# Patient Record
Sex: Female | Born: 1990 | Race: Black or African American | Hispanic: No | Marital: Single | Smoking: Never smoker
Health system: Southern US, Community
[De-identification: ages and names within clinical notes are randomized; demographics above are authoritative.]

## PROBLEM LIST (undated history)

## (undated) DIAGNOSIS — J45909 Unspecified asthma, uncomplicated: Secondary | ICD-10-CM

## (undated) DIAGNOSIS — G43909 Migraine, unspecified, not intractable, without status migrainosus: Secondary | ICD-10-CM

## (undated) DIAGNOSIS — J302 Other seasonal allergic rhinitis: Secondary | ICD-10-CM

---

## 2013-07-29 ENCOUNTER — Emergency Department (INDEPENDENT_AMBULATORY_CARE_PROVIDER_SITE_OTHER)
Admission: EM | Admit: 2013-07-29 | Discharge: 2013-07-29 | Disposition: A | Discharge status: Home / Self Care | Payer: BC Managed Care – PPO | Source: Home / Self Care | Attending: Family Medicine | Admitting: Family Medicine

## 2013-07-29 ENCOUNTER — Encounter (HOSPITAL_COMMUNITY): Payer: Self-pay | Admitting: Emergency Medicine

## 2013-07-29 DIAGNOSIS — M542 Cervicalgia: Secondary | ICD-10-CM

## 2013-07-29 DIAGNOSIS — K120 Recurrent oral aphthae: Secondary | ICD-10-CM

## 2013-07-29 HISTORY — DX: Migraine, unspecified, not intractable, without status migrainosus: G43.909

## 2013-07-29 HISTORY — DX: Other seasonal allergic rhinitis: J30.2

## 2013-07-29 HISTORY — DX: Unspecified asthma, uncomplicated: J45.909

## 2013-07-29 MED ORDER — MAGIC MOUTHWASH W/LIDOCAINE: 5.0000 mL | Freq: Three times a day (TID) | ORAL | Status: DC | PRN

## 2013-07-29 NOTE — ED Notes (Signed)
C/O "canker sore" to right posterior tongue x 1 wk.  Pain now radiating up into right ear.  Having difficulty eating and talking due to pain.  Has been using pain relief gel and "canker covers".

## 2013-07-29 NOTE — ED Provider Notes (Signed)
CSN: [id number]     Arrival date & time 07/29/13  1006 History   First MD Initiated Contact with Patient 07/29/13 1112     Chief Complaint  Patient presents with  . Mouth Lesions   (Consider location/radiation/quality/duration/timing/severity/associated sxs/prior Treatment) HPI  23 year old F with lesions in her mouth and right ear pain.   1. Oral lesion - presently right side of the tongue for one week. It is painful. It is not changing in size. It is not draining or bleeding. It is not associated with fevers. Does make it difficult for her to eat and swallow. She has a history of similar lesions on her fecal mucosa, which resolved within a week. She has tried Orajel. The patient has not been sexually active recently.  Past medical history - negative for HIV, Crohn's disease, Behcet's disease, herpes labialis  2. Ear pain - pain is mild to moderate in 3 years duration. It is actually located posterior and inferior to the ear. It is not associated with decreased hearing, blood, or drainage from the ear. She denies a history of trauma. She denies fever or chills.  Past Medical History  Diagnosis Date  . Seasonal allergies   . Asthma   . Migraines    History reviewed. No pertinent past surgical history. No family history on file. History  Substance Use Topics  . Smoking status: Never Smoker   . Smokeless tobacco: Not on file  . Alcohol Use: No   OB History   Grav Para Term Preterm Abortions TAB SAB Ect Mult Living                 Review of Systems Negative for nausea, vomiting, rash, diarrhea, constipation, bloody stool Allergies  Review of patient's allergies indicates no known allergies.  Home Medications   Current Outpatient Rx  Name  Route  Sig  Dispense  Refill  . desogestrel-ethinyl estradiol (KARIVA,AZURETTE,MIRCETTE) 0.15-0.02/0.01 MG (21/5) tablet   Oral   Take 1 tablet by mouth daily.         . Alum & Mag Hydroxide-Simeth (MAGIC MOUTHWASH W/LIDOCAINE)  SOLN   Oral   Take 5 mLs by mouth 3 (three) times daily as needed for mouth pain.   50 mL   0    BP 113/72  Pulse 78  Temp(Src) 98.6 F (37 C) (Oral)  Resp 18  SpO2 98%  LMP 06/21/2013 Physical Exam Gen: young AA female, well appearing, NAD, pleasant and conversant HEENT: NCAT, PERRLA, EOMI, OP clear and moist, 3 mm white ulcer of right side of tongue without erythema or drainage that is tender; no oropharyngeal exudate, no lymphadenopathy, neck with normal ROM, no meningismus, TM reflective without effusion; mastoid bone non tender  CV: RRR, no m/r/g, no JVD or carotid bruits Pulm: normal WOB, CTA-B Abd: soft, NDNT, NABS Extremities: no edema or joint tenderness Skin: warm, dry, no rashes Neuro/Psych: A&Ox4, normal affect, speech, and thought content  ED Course  Procedures (including critical care time) Labs Review Labs Reviewed - No data to display Imaging Review No results found.   MDM   1. Aphthous ulcer of tongue   2. Neck pain on right side    History physical most consistent with aphthous ulcer. Patient does not have systemic illness or signs of systemic illnesses like HIV, Crohn's, or Behcet's disease. She does not have the other sequelae of herpangina or coxsackievirus. I think this will resolve on its own. Patient was given indications for return.  Neck  pain appears very benign. No concern for infection of the mastoid bone. No evidence of otitis media. Continue to follow as needed.   Garnetta BuddyEdward V Rochell Puett, MD 07/29/13 1201

## 2013-07-29 NOTE — Discharge Instructions (Signed)
Akira,   I think he have an aphthous ulcer that is causing the pain. Thankfully you are a healthy woman otherwise not at risk for any systemic diseases or cancers. If this does not resolve within one week, then please return, because it may need a biopsy. In the meantime please take Orajel and you can use the mouthwash as needed for pain relief.  I hope that you feel better.  Dr. Clinton SawyerWilliamson

## 2013-08-01 NOTE — ED Provider Notes (Signed)
Medical screening examination/treatment/procedure(s) were performed by a resident physician or non-physician practitioner and as the supervising physician I was immediately available for consultation/collaboration.  Delisa Finck, MD   Evian Derringer S Saafir Abdullah, MD 08/01/13 0748 

## 2014-01-14 ENCOUNTER — Encounter (HOSPITAL_COMMUNITY): Payer: Self-pay | Admitting: Emergency Medicine

## 2014-01-14 ENCOUNTER — Emergency Department (HOSPITAL_COMMUNITY)
Admission: EM | Admit: 2014-01-14 | Discharge: 2014-01-15 | Disposition: A | Discharge status: Home / Self Care | Payer: BC Managed Care – PPO | Attending: Emergency Medicine | Admitting: Emergency Medicine

## 2014-01-14 DIAGNOSIS — R110 Nausea: Secondary | ICD-10-CM | POA: Diagnosis not present

## 2014-01-14 DIAGNOSIS — R51 Headache: Secondary | ICD-10-CM

## 2014-01-14 DIAGNOSIS — M549 Dorsalgia, unspecified: Secondary | ICD-10-CM | POA: Diagnosis not present

## 2014-01-14 DIAGNOSIS — J45909 Unspecified asthma, uncomplicated: Secondary | ICD-10-CM | POA: Diagnosis not present

## 2014-01-14 DIAGNOSIS — R079 Chest pain, unspecified: Secondary | ICD-10-CM | POA: Diagnosis not present

## 2014-01-14 DIAGNOSIS — Z79899 Other long term (current) drug therapy: Secondary | ICD-10-CM | POA: Insufficient documentation

## 2014-01-14 DIAGNOSIS — R519 Headache, unspecified: Secondary | ICD-10-CM

## 2014-01-14 DIAGNOSIS — G43909 Migraine, unspecified, not intractable, without status migrainosus: Secondary | ICD-10-CM | POA: Diagnosis present

## 2014-01-14 LAB — HCG, SERUM, QUALITATIVE: Preg, Serum: NEGATIVE

## 2014-01-14 MED ORDER — DIPHENHYDRAMINE HCL 50 MG/ML IJ SOLN
25.0000 mg | Freq: Once | INTRAMUSCULAR | Status: AC
  Administered 2014-01-14: 25 mg via INTRAVENOUS
  Filled 2014-01-14: qty 1

## 2014-01-14 MED ORDER — METHOCARBAMOL 500 MG PO TABS
1000.0000 mg | ORAL_TABLET | Freq: Once | ORAL | Status: AC
  Administered 2014-01-14: 1000 mg via ORAL
  Filled 2014-01-14: qty 2

## 2014-01-14 MED ORDER — METOCLOPRAMIDE HCL 5 MG/ML IJ SOLN
10.0000 mg | Freq: Once | INTRAMUSCULAR | Status: AC
  Administered 2014-01-14: 10 mg via INTRAVENOUS
  Filled 2014-01-14: qty 2

## 2014-01-14 MED ORDER — METHYLPREDNISOLONE SODIUM SUCC 125 MG IJ SOLR
125.0000 mg | Freq: Once | INTRAMUSCULAR | Status: AC
  Administered 2014-01-14: 125 mg via INTRAVENOUS
  Filled 2014-01-14: qty 2

## 2014-01-14 MED ORDER — SODIUM CHLORIDE 0.9 % IV BOLUS (SEPSIS)
1000.0000 mL | Freq: Once | INTRAVENOUS | Status: AC
  Administered 2014-01-14: 1000 mL via INTRAVENOUS

## 2014-01-14 NOTE — ED Notes (Signed)
The pt has had a headache for 30-45 minutes.  Followed by chest pain back ain for 30-45 minutes also .  No injury..  No distress

## 2014-01-14 NOTE — ED Provider Notes (Signed)
CSN: [id number]     Arrival date & time 01/14/14  2013 History   First MD Initiated Contact with Patient 01/14/14 2249     Chief Complaint  Patient presents with  . Migraine     (Consider location/radiation/quality/duration/timing/severity/associated sxs/prior Treatment) HPI  Christy Woods is a 23 y.o. female complaining of migraine exacerbation lasting approximately 2 hours. Patient also states that she has upper back and chest pain. She denies pleuritic pain, fever, chills, shortness of breath, cough, history of DVT or PE. Patient has been evaluated by neurologist formal diagnosis of migraine. She states her pain is bilateral periorbital, rated at 3/10 associated with nausea and photophobia. She states that she only has the upper back and chest pain when the headache is severe. This is typical for her prior headache exacerbations. She used to take Topamax but has not had a prescription in several months. She recently moved to the area to attend school. Pt denies fever, rash, confusion, cervicalgia, LOC/syncope, change in vision, N/V, numbness, weakness, dysarthria, ataxia, thunderclap onset, exacerbation with exertion or valsalva, exacerbation in morning, abdominal pain, change in bowel or bladder habits, family history of early cardiac death.   Past Medical History  Diagnosis Date  . Seasonal allergies   . Asthma   . Migraines    History reviewed. No pertinent past surgical history. No family history on file. History  Substance Use Topics  . Smoking status: Never Smoker   . Smokeless tobacco: Not on file  . Alcohol Use: No   OB History   Grav Para Term Preterm Abortions TAB SAB Ect Mult Living                 Review of Systems    Allergies  Review of patient's allergies indicates no known allergies.  Home Medications   Prior to Admission medications   Medication Sig Start Date End Date Taking? Authorizing Provider  Alum & Mag Hydroxide-Simeth (MAGIC  MOUTHWASH W/LIDOCAINE) SOLN Take 5 mLs by mouth 3 (three) times daily as needed for mouth pain. 07/29/13   Garnetta Buddy, MD  butalbital-acetaminophen-caffeine (FIORICET) [phone] MG per tablet Take 1 tablet by mouth every 6 (six) hours as needed for headache. 01/15/14 01/15/15  Joni Reining Briarrose Shor, PA-C  desogestrel-ethinyl estradiol (KARIVA,AZURETTE,MIRCETTE) 0.15-0.02/0.01 MG (21/5) tablet Take 1 tablet by mouth daily.    Historical Provider, MD   BP 116/82  Pulse 79  Temp(Src) 98.5 F (36.9 C) (Oral)  Resp 21  Ht  (1.575 m)  Wt 150 lb (68.04 kg)  BMI 27.43 kg/m2  SpO2 100% Physical Exam  Nursing note and vitals reviewed. Constitutional: She is oriented to person, place, and time. She appears well-developed and well-nourished.  HENT:  Head: Normocephalic and atraumatic.  Mouth/Throat: Oropharynx is clear and moist.  Eyes: Conjunctivae and EOM are normal. Pupils are equal, round, and reactive to light.  Neck: Normal range of motion. Neck supple.  FROM to C-spine. Pt can touch chin to chest without discomfort. No TTP of midline cervical spine.   Cardiovascular: Normal rate, regular rhythm and intact distal pulses.   Pulmonary/Chest: Effort normal and breath sounds normal. No respiratory distress. She has no wheezes. She has no rales. She exhibits no tenderness.  Abdominal: Soft. Bowel sounds are normal. There is no tenderness.  Musculoskeletal: Normal range of motion. She exhibits no edema and no tenderness.  Neurological: She is alert and oriented to person, place, and time. No cranial nerve deficit.  II-Visual fields grossly intact. III/IV/VI-Extraocular movements  intact.  Pupils reactive bilaterally. V/VII-Smile symmetric, equal eyebrow raise,  facial sensation intact VIII- Hearing grossly intact IX/X-Normal gag XI-bilateral shoulder shrug XII-midline tongue extension Motor: 5/5 bilaterally with normal tone and bulk Cerebellar: Normal finger-to-nose  and normal  heel-to-shin test.   Romberg negative Ambulates with a coordinated gait   Skin: No rash noted.    ED Course  Procedures (including critical care time) Labs Review Labs Reviewed  HCG, SERUM, QUALITATIVE    Imaging Review No results found.   EKG Interpretation None      MDM   Final diagnoses:  Acute nonintractable headache, unspecified headache type    Filed Vitals:   01/14/14 2031 01/14/14 2328 01/14/14 2330 01/15/14 0017  BP: 122/85 127/88 115/81 116/82  Pulse: 89 95 80 79  Temp: 98.1 F (36.7 C) 98.6 F (37 C)  98.5 F (36.9 C)  TempSrc: Oral Oral  Oral  Resp: Height:  (1.575 m)     Weight: 150 lb (68.04 kg)     SpO2: 100% 100% 99% 100%    Medications  sodium chloride 0.9 % bolus 1,000 mL (1,000 mLs Intravenous New Bag/Given 01/14/14 2321)  metoCLOPramide (REGLAN) injection 10 mg (10 mg Intravenous Given 01/14/14 2322)  diphenhydrAMINE (BENADRYL) injection 25 mg (25 mg Intravenous Given 01/14/14 2322)  methylPREDNISolone sodium succinate (SOLU-MEDROL) 125 mg/2 mL injection 125 mg (125 mg Intravenous Given 01/14/14 2322)  methocarbamol (ROBAXIN) tablet 1,000 mg (1,000 mg Oral Given 01/14/14 2326)    Christy Woods is a 23 y.o. female presenting with a grade exacerbation. Very mild, 3/10. Patient states she has upper back and chest pain. States that this is only when her headache is severe. HA. Presentation is like pts typical HA and non concerning for [facility], ICH, Meningitis, or temporal arteritis. Pt is afebrile with no focal neuro deficits, nuchal rigidity, or change in vision. Pt is to follow up with PCP to discuss prophylactic medication. Pt verbalizes understanding and is agreeable with plan to dc.  Evaluation does not show pathology that would require ongoing emergent intervention or inpatient treatment. Pt is hemodynamically stable and mentating appropriately. Discussed findings and plan with patient/guardian, who agrees with care plan.  All questions answered. Return precautions discussed and outpatient follow up given.   New Prescriptions   BUTALBITAL-ACETAMINOPHEN-CAFFEINE (FIORICET) 50-325-40 MG PER TABLET    Take 1 tablet by mouth every 6 (six) hours as needed for headache.         Wynetta Emery, PA-C 01/15/14 0025

## 2014-01-15 MED ORDER — BUTALBITAL-APAP-CAFFEINE 50-325-40 MG PO TABS: 1.0000 | ORAL_TABLET | Freq: Four times a day (QID) | ORAL | Status: AC | PRN

## 2014-01-15 NOTE — Discharge Instructions (Signed)
Do not hesitate to return to the emergency room for any new, worsening or concerning symptoms. ° °Please obtain primary care using resource guide below. But the minute you were seen in the emergency room and that they will need to obtain records for further outpatient management. ° ° ° °Emergency Department Resource Guide °1) Find a Doctor and Pay Out of Pocket °Although you won't have to find out who is covered by your insurance plan, it is a good idea to ask around and get recommendations. You will then need to call the office and see if the doctor you have chosen will accept you as a new patient and what types of options they offer for patients who are self-pay. Some doctors offer discounts or will set up payment plans for their patients who do not have insurance, but you will need to ask so you aren't surprised when you get to your appointment. ° °2) Contact Your Local Health Department °Not all health departments have doctors that can see patients for sick visits, but many do, so it is worth a call to see if yours does. If you don't know where your local health department is, you can check in your phone book. The CDC also has a tool to help you locate your state's health department, and many state websites also have listings of all of their local health departments. ° °3) Find a Walk-in Clinic °If your illness is not likely to be very severe or complicated, you may want to try a walk in clinic. These are popping up all over the country in pharmacies, drugstores, and shopping centers. They're usually staffed by nurse practitioners or physician assistants that have been trained to treat common illnesses and complaints. They're usually fairly quick and inexpensive. However, if you have serious medical issues or chronic medical problems, these are probably not your best option. ° °No Primary Care Doctor: °- Call [facility] at  832-8000 - they can help you locate a primary care doctor that  accepts your  insurance, provides certain services, etc. °- Physician Referral Service- 1-800-533-3463 ° °Chronic Pain Problems: °Organization         Address  Phone   Notes  °[facility]  (336) 297-2271 Patients need to be referred by their primary care doctor.  ° °Medication Assistance: °Organization         Address  Phone   Notes  °[place] [facility] [address] °[place], [place] [address] (336) 641-8030 --Must be a resident of [place] °-- Must have NO insurance coverage whatsoever (no Medicaid/ Medicare, etc.) °-- The pt. MUST have a primary care doctor that directs their care regularly and follows them in the community °  °[facility]  (866) 331-1348   °[employer]  (888) 892-1162   ° °Agencies that provide inexpensive medical care: °Organization         Address  Phone   Notes  °[facility]  (336) 832-8035   °[facility]    (336) 832-7272   °[facility] [address] °[place], [place] [address] (336) 832-4777   °[facility] [address], °[place] (336) 271-4999   °[facility]    (336) 373-0678   °[facility]    (336) 272-1050   °[facility] ° [address],  Phone:  (336) 832-4444, Fax:  (336) 832-4440 Hours of Operation:  9 am -   6 pm, M-F.  Also accepts Medicaid/Medicare and self-pay.  °[facility] ° [address], [place] Phone: (336) 832-3150, Fax: (336) [phone]. Hours of Operation:  8:30 am - 5:30 pm, M-F.  Also accepts Medicaid and self-pay.  °[facility] [place] [address], [place] Phone: (336) 878-6027   °[facility] [address], [place], [place] (336)723-1848, Ext. 123 Mondays & Thursdays: 7-9 AM.  First 15 patients are seen on a first come, first serve basis. °  ° °Medicaid-accepting [place] Providers: ° °Organization          Address  Phone   Notes  °[facility] [address], [place] (336) 641-2100 Also accepts self-pay patients.  °[facility] [address], [place] ° (336) 856-9996   °[facility] [address], [place] (336) 288-8857   °[facility] [address] [place] [address], [place] (336) 299-7000   °Veita Bland [address], [place]  ° (336) 373-1557 Only accepts [place] Access Medicaid patients after they have their name applied to their card.  ° °Self-Pay (no insurance) in [place]: ° °Organization         Address  Phone   Notes  °Sickle Cell Patients, [facility] [address], [place] (336) 832-1970   °[facility] [address], [place] (336) 832-4400   °[facility] ° [address], [place] (336) 992-4800   °[facility]/Dr. Osei-Bonsu ° [address] [place] [address], [place] or [address], [place] (336) 841-8500 Phone number for both [place] and [place] locations is the same.  °[facility] [address], [place] (336) 299-0000   °[facility] [address] [place] [address], [place] or [address] (336) 852-7530 °(336) 878-2260   °[facility] [address], [place] (336) 350-1642, phone; (336) 294-5005, fax Sees patients 1st and 3rd Saturday of every month.  Must not qualify for public or private insurance (i.e. Medicaid, Medicare, [place] Health Choice, Veterans' Benefits) • Household income should be no more than 200% of the poverty level •The clinic cannot treat you if you are pregnant or think you are pregnant • Sexually transmitted diseases are not treated at the clinic.  ° ° °Dental Care: °Organization         Address  Phone  Notes  °[facility] [address],  [place] (336) 641-6152 Accepts children up to age 21 who are enrolled in Medicaid or [place] Health Choice; pregnant women with a Medicaid card; and children who have applied for Medicaid or [place] Health Choice, but were declined, whose parents can pay a reduced fee at time of service.  °[facility] [place]  [address], [place] (336) 641-7733 Accepts children up to age 21 who are enrolled in Medicaid or [place] Health Choice; pregnant women with a Medicaid card; and children who have applied for Medicaid or [place] Health Choice, but were declined, whose parents can pay a reduced fee at time of service.  °[facility] ° [address], [place] (336) 641-4533 Patients are seen by appointment only. Walk-ins are not accepted. [facility] will see patients 18 years of age and   older. °Monday - Tuesday (8am-5pm) °Most Wednesdays (8:30-5pm) °$30 per visit, cash only  °[facility] ° [address], [place] (336) 641-4533 Patients are seen by appointment only. Walk-ins are not accepted. [facility] will see patients 18 years of age and older. °One Wednesday Evening (Monthly: Volunteer Based).  $30 per visit, cash only  °[facility]  (919) 537-3737 for adults; Children under age 4, call Graduate Pediatric Dentistry at (919) [phone]. Children aged 4-14, please call (919) [phone] to request a pediatric application. ° Dental services are provided in all areas of dental care including fillings, crowns and bridges, complete and partial dentures, implants, gum treatment, root canals, and extractions. Preventive care is also provided. Treatment is provided to both adults and children. °Patients are selected via a lottery and there is often a waiting list. °  °[facility] [address], °[place] ° (336) 763-8833 [email or web address] °  °[facility] [address], [place], [place]  (336)723-1848, Ext. 123 Second and Fourth Thursday of each month, opens at 6:30 AM; Clinic ends at 9 AM.  Patients are seen on a first-come first-served basis, and a limited number are seen during each clinic.  ° °[facility] ° [address], [place], [place] (336) 723-7904   Eligibility Requirements °You must have lived in [place], [place], or [place] counties for at least the last three months. °  You cannot be eligible for state or federal sponsored healthcare insurance, including Veterans Administration, Medicaid, or Medicare. °  You generally cannot be eligible for healthcare insurance through your employer.  °  How to apply: °Eligibility screenings are held every Tuesday and Wednesday afternoon from 1:00 pm until 4:00 pm. You do not need an appointment for the interview!  °[facility] [address], [place], [place] 336-631-2330   °[facility] [facility]  336-342-8273   °[facility]  336-703-3100   °[facility]  336-570-6415   ° °Behavioral Health Resources in the Community: °Intensive Outpatient Programs °Organization         Address  Phone  Notes  °[facility] [address], [place], [place] 336-878-6098   °[facility] [address], [place], [place] 336-832-9800   °[facility] [address], [place], [place] ° 336-882-2125   °[facility] [address],  °[place], [place] 1-800-853-5163 or 336-641-4981   °Substance Abuse Resources °Organization         Address  Phone  Notes  °[facility]  336-882-2125   °[facility]  336-784-9470   °[facility]  336-285-9073   °[facility]  336-845-3988   °Residential & Outpatient Substance Abuse Program  1-800-659-3381   °Psychological Services °Organization         Address  Phone  Notes  °[facility]  336- 832-9600   °[facility]  336- 378-7881    °[facility] [address], [place] 1-800-853-5163 or 336-641-4981   ° °Mobile Crisis Teams °Organization         Address  Phone  Notes  °[facility], Mobile Crisis Care Unit  1-877-626-1772   °Assertive °[facility] ° [address] [place],  336-834-9664   °Sharon DeEsch [address] °[place]  336-554-5454   ° °Self-Help/Support Groups °Organization         Address    Phone             Notes  °[facility] [place] - variety of support groups  336- 373-1402 Call for more information  °Narcotics Anonymous (NA), [facility] [address], °[place]  2 meetings at this location  ° °Residential Treatment Programs °Organization         Address  Phone  Notes  °[facility] [address],    °[place]  1-866-801-8205   °[facility] ° [address], [place], [place] 704-293-8524   °[facility] [address], [place] 336-845-3988 Admissions: 8am-3pm M-F  °[facility] [address]    °[place], [place] 336-841-1104   °[facility] [address], [place], [place] 336-379-7146   °[facility] [address]  °[place], [place] 336-285-9073   °[facility] - Intensive Outpatient [address], [place], [place] 336-852-3033   °[facility] [address]  °[place], [place] 1-877-615-2722 or 336-784-9470   °[facility] [address] [place], [place] 336-227-7417 Accepts Medicaid  °[facility] [address]  °[place] 1-800-659-3381 Substance Abuse/Addiction Treatment  ° °[place] Behavioral Health Resources °Organization         Address  Phone  Notes  °[facility]  (888) 581-9988   °Julie Brannon, PhD [address] [place], [place]   (336) 349-5553 or (336) 951-0000   °[facility]   [address] °[place], [place] (336) 349-4454   °[facility] [address], [place], [place] (336) 342-8316 Insurance/Medicaid/sponsorship through [facility]  °[facility] [address]                                    [place],  (336) 342-8316 Therapy/tele-psych/case  °[facility] [address]  ° [place],  (336) 349-2233    °Dr. Arfeen  (336) 349-4544   °[facility] [place]  [employer] [place] [facility] 1) [address], [place] °2) [address], [place] °3)  [address], [place] (336) 349-3220 °(336) 342-7768 ° °(336) 342-8140   °[place] Child Abuse [facility] (336) 342-1394 or (336) 342-3537 (After Hours)    ° ° ° °

## 2014-01-16 NOTE — ED Provider Notes (Signed)
Medical screening examination/treatment/procedure(s) were performed by non-physician practitioner and as supervising physician I was immediately available for consultation/collaboration.  Tressa Maldonado L Arul Farabee, MD 01/16/14 0737 

## 2015-05-27 ENCOUNTER — Ambulatory Visit (INDEPENDENT_AMBULATORY_CARE_PROVIDER_SITE_OTHER): Payer: BLUE CROSS/BLUE SHIELD | Admitting: Family Medicine

## 2015-05-27 VITALS — BP 120/80 | HR 73 | Temp 98.0°F | Resp 17 | Ht 63.0 in | Wt 176.0 lb

## 2015-05-27 DIAGNOSIS — R8299 Other abnormal findings in urine: Secondary | ICD-10-CM | POA: Diagnosis not present

## 2015-05-27 DIAGNOSIS — R8290 Unspecified abnormal findings in urine: Secondary | ICD-10-CM

## 2015-05-27 LAB — POC MICROSCOPIC URINALYSIS (UMFC): MUCUS RE: ABSENT

## 2015-05-27 LAB — POCT URINALYSIS DIP (MANUAL ENTRY)
BILIRUBIN UA: NEGATIVE
Bilirubin, UA: NEGATIVE
GLUCOSE UA: NEGATIVE
Leukocytes, UA: NEGATIVE
Nitrite, UA: NEGATIVE
Protein Ur, POC: NEGATIVE
SPEC GRAV UA: 1.01
Urobilinogen, UA: 0.2
pH, UA: 6

## 2015-05-27 NOTE — Patient Instructions (Signed)

## 2015-05-27 NOTE — Progress Notes (Signed)
Subjective:  By signing my name below, I, Christy Woods, attest that this documentation has been prepared under the direction and in the presence of Christy Sorenson, MD.  Christy Woods, Medical Scribe. 05/27/2015.  4:12 PM.   Patient ID: Christy Woods, female    DOB: [birth year], 25 y.o.   MRN: [id number]  Chief Complaint  Patient presents with  . Dysuria    HPI HPI Comments: Christy Woods is a 25 y.o. female who presents to [facility] complaining of green colored urine, onset starting today. She states that she has been constipated recently, however she indicates that she usually does experience that prior to menstruating. She indicates that her diet has changed since 2 weeks ago, and is including more green nutrients. She also notes increasing her water intake to 1 Gallon per day in the effort of being healthier. She has also been taking vitamin C, and melatonin starting last week, however she is taking no other medications. She indicates having a history of BV, and yeast infections. Pt is currently on her period, and reports using tampons usually. She denies dysuria, urinary frequency, vaginal discharge, enuresis, kidney pain, unexpected muscle pain, diaphoresis, fever, chills, nausea that is different from when she is normally menstruating, or vomiting. Pt is not currently sexually active, and reports no chance of being pregnant.    There are no active problems to display for this patient.  Past Medical History  Diagnosis Date  . Seasonal allergies   . Asthma   . Migraines    No past surgical history on file. No Known Allergies Prior to Admission medications   Not on File   Social History   Social History  . Marital Status: Single    Spouse Name: N/A  . Number of Children: N/A  . Years of Education: N/A   Occupational History  . Not on file.   Social History Main Topics  . Smoking status: Never Smoker   . Smokeless tobacco:  Not on file  . Alcohol Use: No  . Drug Use: No  . Sexual Activity: Not on file   Other Topics Concern  . Not on file   Social History Narrative    Review of Systems  Constitutional: Negative for fever, chills and diaphoresis.  Gastrointestinal: Positive for constipation. Negative for nausea and vomiting.  Genitourinary: Negative for dysuria, frequency, flank pain, vaginal discharge and enuresis.  Musculoskeletal: Negative for myalgias.      Objective:   Physical Exam  Constitutional: She is oriented to person, place, and time. She appears well-developed and well-nourished. No distress.  HENT:  Head: Normocephalic and atraumatic.  Eyes: EOM are normal. Pupils are equal, round, and reactive to light.  Neck: Neck supple.  Cardiovascular: Normal rate and regular rhythm.   Pulmonary/Chest: Effort normal and breath sounds normal. No respiratory distress. She has no wheezes. She exhibits no tenderness.  Abdominal: Soft. Bowel sounds are normal. She exhibits no distension. There is no hepatosplenomegaly. There is no tenderness. There is no CVA tenderness.  Neurological: She is alert and oriented to person, place, and time. No cranial nerve deficit.  Skin: Skin is warm and dry.  Psychiatric: She has a normal mood and affect. Her behavior is normal.  Nursing note and vitals reviewed.   BP 120/80 mmHg  Pulse 73  Temp(Src) 98 F (36.7 C) (Oral)  Resp 17  Ht  (1.6 m)  Wt 176 lb (79.833 kg)  BMI 31.18 kg/m2  SpO2 98%  LMP 05/26/2015     Results for orders placed or performed in visit on 05/27/15  POCT urinalysis dipstick  Result Value Ref Range   Color, UA yellow yellow   Clarity, UA clear clear   Glucose, UA negative negative   Bilirubin, UA negative negative   Ketones, POC UA negative negative   Spec Grav, UA 1.010    Blood, UA small (A) negative   pH, UA 6.0    Protein Ur, POC negative negative   Urobilinogen, UA 0.2    Nitrite, UA Negative Negative   Leukocytes,  UA Negative Negative  POCT Microscopic Urinalysis ([facility])  Result Value Ref Range   WBC,UR,HPF,POC None None WBC/hpf   RBC,UR,HPF,POC None None RBC/hpf   Bacteria None None, Too numerous to count   Mucus Absent Absent   Epithelial Cells, UR Per Microscopy Few (A) None, Too numerous to count cells/hpf    Assessment & Plan:   1. Abnormal urinary product   Pt's urine did initially appear to be slightly green-tinged - several of our lab employees thought it looked like "lime gatorade" (although appeared to me to be more of a Mountain Dew hue).  However, in differing lights and against differing backgrounds it was less impressive/distinct of a color. Pt reassured that asymptomatic with nml UA and as just occurred today I suspect this is likely from some intake/food - really not taking any meds or supps. Reviewed up-to-date but while there are many abnormal urine colors asstd with particular foods or metabolic diseases, green does not seem to be one of them.  Watchful waiting. RTC if cont. Push water.     Orders Placed This Encounter  Procedures  . POCT urinalysis dipstick  . POCT Microscopic Urinalysis ([facility])     I personally performed the services described in this documentation, which was scribed in my presence. The recorded information has been reviewed and considered, and addended by me as needed.  Christy Sorenson, MD MPH

## 2015-08-05 ENCOUNTER — Encounter (HOSPITAL_COMMUNITY): Payer: Self-pay | Admitting: Emergency Medicine

## 2015-08-05 ENCOUNTER — Emergency Department (HOSPITAL_COMMUNITY)
Admission: EM | Admit: 2015-08-05 | Discharge: 2015-08-06 | Disposition: A | Discharge status: Home / Self Care | Payer: BLUE CROSS/BLUE SHIELD | Attending: Emergency Medicine | Admitting: Emergency Medicine

## 2015-08-05 DIAGNOSIS — K5900 Constipation, unspecified: Secondary | ICD-10-CM | POA: Diagnosis not present

## 2015-08-05 DIAGNOSIS — Z3202 Encounter for pregnancy test, result negative: Secondary | ICD-10-CM | POA: Diagnosis not present

## 2015-08-05 DIAGNOSIS — R110 Nausea: Secondary | ICD-10-CM | POA: Diagnosis not present

## 2015-08-05 DIAGNOSIS — R197 Diarrhea, unspecified: Secondary | ICD-10-CM | POA: Diagnosis not present

## 2015-08-05 DIAGNOSIS — R1012 Left upper quadrant pain: Secondary | ICD-10-CM | POA: Diagnosis present

## 2015-08-05 DIAGNOSIS — J45909 Unspecified asthma, uncomplicated: Secondary | ICD-10-CM | POA: Insufficient documentation

## 2015-08-05 DIAGNOSIS — R109 Unspecified abdominal pain: Secondary | ICD-10-CM

## 2015-08-05 DIAGNOSIS — Z79899 Other long term (current) drug therapy: Secondary | ICD-10-CM | POA: Diagnosis not present

## 2015-08-05 DIAGNOSIS — Z8679 Personal history of other diseases of the circulatory system: Secondary | ICD-10-CM | POA: Insufficient documentation

## 2015-08-05 LAB — COMPREHENSIVE METABOLIC PANEL
ALBUMIN: 4.2 g/dL (ref 3.5–5.0)
ALK PHOS: 57 U/L (ref 38–126)
ALT: 13 U/L — AB (ref 14–54)
ANION GAP: 9 (ref 5–15)
AST: 19 U/L (ref 15–41)
BUN: 12 mg/dL (ref 6–20)
CALCIUM: 9.5 mg/dL (ref 8.9–10.3)
CO2: 25 mmol/L (ref 22–32)
CREATININE: 0.99 mg/dL (ref 0.44–1.00)
Chloride: 107 mmol/L (ref 101–111)
GFR calc Af Amer: >60 mL/min (ref >60)
GFR calc non Af Amer: >60 mL/min (ref >60)
GLUCOSE: 97 mg/dL (ref 65–99)
Potassium: 3.9 mmol/L (ref 3.5–5.1)
SODIUM: 141 mmol/L (ref 135–145)
Total Bilirubin: 0.8 mg/dL (ref 0.3–1.2)
Total Protein: 7.7 g/dL (ref 6.5–8.1)

## 2015-08-05 LAB — CBC
HCT: 38.6 % (ref 36.0–46.0)
Hemoglobin: 13.4 g/dL (ref 12.0–15.0)
MCH: 28.8 pg (ref 26.0–34.0)
MCHC: 34.7 g/dL (ref 30.0–36.0)
MCV: 82.8 fL (ref 78.0–100.0)
Platelets: 346 10*3/uL (ref 150–400)
RBC: 4.66 MIL/uL (ref 3.87–5.11)
RDW: 13.5 % (ref 11.5–15.5)
WBC: 8.8 10*3/uL (ref 4.0–10.5)

## 2015-08-05 LAB — I-STAT BETA HCG BLOOD, ED (MC, WL, AP ONLY)

## 2015-08-05 LAB — LIPASE, BLOOD: Lipase: 29 U/L (ref 11–51)

## 2015-08-05 NOTE — ED Notes (Signed)
Pt states that she has had LUQ pain since Sunday. Nausea w/o emesis. Denies diarrhea. States she has chronic abdominal pain. Alert and oriented.

## 2015-08-05 NOTE — ED Provider Notes (Signed)
CSN: [id number]     Arrival date & time 08/05/15  2214 History   By signing my name below, I, Christy Woods, attest that this documentation has been prepared under the direction and in the presence of Mancel BaleElliott Elycia Woodside, MD.  Electronically Signed: Arlan OrganAshley Woods, ED Scribe. 08/05/2015. 11:53 PM.   Chief Complaint  Patient presents with  . Abdominal Pain   The history is provided by the patient. No language interpreter was used.    HPI Comments: Christy Woods is a 25 y.o. female without any pertinent past medical history who presents to the Emergency Department complaining of intermittent, ongoing LUQ abdominal pain x 2 days. Pain is described as sharp. Pt also reports ongoing constipation, diarrhea, and nausea. No OTC medications or home remedies attempted prior to arrival. No recent fever, chills, or vomiting. Pt does not take any daily medications. No known allergies to medications.  PCP: No PCP Per Patient    Past Medical History  Diagnosis Date  . Seasonal allergies   . Asthma   . Migraines    History reviewed. No pertinent past surgical history. History reviewed. No pertinent family history. Social History  Substance Use Topics  . Smoking status: Never Smoker   . Smokeless tobacco: None  . Alcohol Use: No   OB History    No data available     Review of Systems  Constitutional: Negative for fever and chills.  Respiratory: Negative for cough.   Cardiovascular: Negative for chest pain.  Gastrointestinal: Positive for nausea, abdominal pain, diarrhea and constipation. Negative for vomiting.  Neurological: Negative for headaches.  Psychiatric/Behavioral: Negative for confusion.  All other systems reviewed and are negative.     Allergies  Pollen extract  Home Medications   Prior to Admission medications   Medication Sig Start Date End Date Taking? Authorizing Provider  Omega-3 Fatty Acids (FISH OIL) 1000 MG CAPS Take 1,000 mg by mouth daily.   Yes Historical  Provider, MD   Triage Vitals: BP 114/87 mmHg  Pulse 81  Temp(Src) 97.7 F (36.5 C) (Oral)  Resp 16  SpO2 99%  LMP 07/27/2015 (Approximate)   Physical Exam  Constitutional: She is oriented to person, place, and time. She appears well-developed and well-nourished.  HENT:  Head: Normocephalic and atraumatic.  Eyes: Conjunctivae and EOM are normal. Pupils are equal, round, and reactive to light.  Neck: Normal range of motion and phonation normal. Neck supple.  Cardiovascular: Normal rate and regular rhythm.   Pulmonary/Chest: Effort normal and breath sounds normal. She exhibits no tenderness.  Abdominal: Soft. She exhibits no distension. There is no tenderness. There is no guarding.  Mild LUQ and LLQ abdominal tenderness noted  Musculoskeletal: Normal range of motion.  Neurological: She is alert and oriented to person, place, and time. She exhibits normal muscle tone.  Skin: Skin is warm and dry.  Psychiatric: She has a normal mood and affect. Her behavior is normal. Judgment and thought content normal.  Nursing note and vitals reviewed.   ED Course  Procedures (including critical care time)  DIAGNOSTIC STUDIES: Oxygen Saturation is 99% on RA, Normal by my interpretation.    COORDINATION OF CARE: 11:49 PM- Will order blood work and urinalysis. Discussed treatment plan with pt at bedside and pt agreed to plan.     Medications - No data to display  Patient Vitals for the past 24 hrs:  BP Temp Temp src Pulse Resp SpO2  08/06/15 0036 122/88 mmHg - - 75 16 98 %  08/05/15 2218 114/87 mmHg 97.7 F (36.5 C) Oral 81 16 99 %    1:42 AM Reevaluation with update and discussion. After initial assessment and treatment, an updated evaluation reveals She states that she is more comfortable at this time, without any specific treatment.. She is not nauseated. Findings discussed with the patient and all questions were answered. [name]     Labs Review Labs Reviewed  COMPREHENSIVE  METABOLIC PANEL - Abnormal; Notable for the following:    ALT 13 (*)    All other components within normal limits  LIPASE, BLOOD  CBC  URINALYSIS, ROUTINE W REFLEX MICROSCOPIC (NOT AT [facility])  I-STAT BETA HCG BLOOD, ED ([facility], [facility], [facility] ONLY)    Imaging Review No results found. I have personally reviewed and evaluated these images and lab results as part of my medical decision-making.   EKG Interpretation None      MDM   Final diagnoses:  Abdominal pain, unspecified abdominal location  Constipation, unspecified constipation type    Evaluation consistent with constipation. Doubt colitis, serious bacterial infection. Metabolic instability or impending vascular collapse.  Nursing Notes Reviewed/ Care Coordinated Applicable Imaging Reviewed Interpretation of Laboratory Data incorporated into ED treatment  The patient appears reasonably screened and/or stabilized for discharge and I doubt any other medical condition or other [facility] requiring further screening, evaluation, or treatment in the ED at this time prior to discharge.  Plan: Home Medications- Stool softener; Home Treatments- increase dietary fluid and fiber; return here if the recommended treatment, does not improve the symptoms; Recommended follow up- PCP prn\   I personally performed the services described in this documentation, which was scribed in my presence. The recorded information has been reviewed and is accurate.    Mancel Bale, MD 08/06/15 [id number]

## 2015-08-06 ENCOUNTER — Emergency Department (HOSPITAL_COMMUNITY): Payer: BLUE CROSS/BLUE SHIELD

## 2015-08-06 ENCOUNTER — Other Ambulatory Visit (HOSPITAL_COMMUNITY): Payer: BLUE CROSS/BLUE SHIELD

## 2015-08-06 LAB — URINALYSIS, ROUTINE W REFLEX MICROSCOPIC
Bilirubin Urine: NEGATIVE
Glucose, UA: NEGATIVE mg/dL
Hgb urine dipstick: NEGATIVE
KETONES UR: NEGATIVE mg/dL
Nitrite: NEGATIVE
PH: 7 (ref 5.0–8.0)
Protein, ur: NEGATIVE mg/dL
Specific Gravity, Urine: 1.018 (ref 1.005–1.030)

## 2015-08-06 LAB — URINE MICROSCOPIC-ADD ON

## 2015-08-06 NOTE — ED Notes (Signed)
In Ct/XRAy

## 2015-08-06 NOTE — Discharge Instructions (Signed)
Use magnesium citrate, one bottle over 12 hours. After that begin using Colace twice a day for 2 months. Try to eat a high-fiber diet and drink a lot of water each day.   Abdominal Pain, Adult Many things can cause abdominal pain. Usually, abdominal pain is not caused by a disease and will improve without treatment. It can often be observed and treated at home. Your health care provider will do a physical exam and possibly order blood tests and X-rays to help determine the seriousness of your pain. However, in many cases, more time must pass before a clear cause of the pain can be found. Before that point, your health care provider may not know if you need more testing or further treatment. HOME CARE INSTRUCTIONS Monitor your abdominal pain for any changes. The following actions may help to alleviate any discomfort you are experiencing:  Only take over-the-counter or prescription medicines as directed by your health care provider.  Do not take laxatives unless directed to do so by your health care provider.  Try a clear liquid diet (broth, tea, or water) as directed by your health care provider. Slowly move to a bland diet as tolerated. SEEK MEDICAL CARE IF:  You have unexplained abdominal pain.  You have abdominal pain associated with nausea or diarrhea.  You have pain when you urinate or have a bowel movement.  You experience abdominal pain that wakes you in the night.  You have abdominal pain that is worsened or improved by eating food.  You have abdominal pain that is worsened with eating fatty foods.  You have a fever. SEEK IMMEDIATE MEDICAL CARE IF:  Your pain does not go away within 2 hours.  You keep throwing up (vomiting).  Your pain is felt only in portions of the abdomen, such as the right side or the left lower portion of the abdomen.  You pass bloody or black tarry stools. MAKE SURE YOU:  Understand these instructions.  Will watch your condition.  Will get  help right away if you are not doing well or get worse.   This information is not intended to replace advice given to you by your health care provider. Make sure you discuss any questions you have with your health care provider.   Document Released: 01/27/2005 Document Revised: 01/08/2015 Document Reviewed: 12/27/2012 Elsevier Interactive Patient Education 2016 [employer]  Constipation, Adult Constipation is when a person has fewer than three bowel movements a week, has difficulty having a bowel movement, or has stools that are dry, hard, or larger than normal. As people grow older, constipation is more common. A low-fiber diet, not taking in enough fluids, and taking certain medicines may make constipation worse.  CAUSES   Certain medicines, such as antidepressants, pain medicine, iron supplements, antacids, and water pills.   Certain diseases, such as diabetes, irritable bowel syndrome (IBS), thyroid disease, or depression.   Not drinking enough water.   Not eating enough fiber-rich foods.   Stress or travel.   Lack of physical activity or exercise.   Ignoring the urge to have a bowel movement.   Using laxatives too much.  SIGNS AND SYMPTOMS   Having fewer than three bowel movements a week.   Straining to have a bowel movement.   Having stools that are hard, dry, or larger than normal.   Feeling full or bloated.   Pain in the lower abdomen.   Not feeling relief after having a bowel movement.  DIAGNOSIS  Your health care  provider will take a medical history and perform a physical exam. Further testing may be done for severe constipation. Some tests may include:  A barium enema X-ray to examine your rectum, colon, and, sometimes, your small intestine.   A sigmoidoscopy to examine your lower colon.   A colonoscopy to examine your entire colon. TREATMENT  Treatment will depend on the severity of your constipation and what is causing it. Some dietary  treatments include drinking more fluids and eating more fiber-rich foods. Lifestyle treatments may include regular exercise. If these diet and lifestyle recommendations do not help, your health care provider may recommend taking over-the-counter laxative medicines to help you have bowel movements. Prescription medicines may be prescribed if over-the-counter medicines do not work.  HOME CARE INSTRUCTIONS   Eat foods that have a lot of fiber, such as fruits, vegetables, whole grains, and beans.  Limit foods high in fat and processed sugars, such as french fries, hamburgers, cookies, candies, and soda.   A fiber supplement may be added to your diet if you cannot get enough fiber from foods.   Drink enough fluids to keep your urine clear or pale yellow.   Exercise regularly or as directed by your health care provider.   Go to the restroom when you have the urge to go. Do not hold it.   Only take over-the-counter or prescription medicines as directed by your health care provider. Do not take other medicines for constipation without talking to your health care provider first.  SEEK IMMEDIATE MEDICAL CARE IF:   You have bright red blood in your stool.   Your constipation lasts for more than 4 days or gets worse.   You have abdominal or rectal pain.   You have thin, pencil-like stools.   You have unexplained weight loss. MAKE SURE YOU:   Understand these instructions.  Will watch your condition.  Will get help right away if you are not doing well or get worse.   This information is not intended to replace advice given to you by your health care provider. Make sure you discuss any questions you have with your health care provider.   Document Released: 01/16/2004 Document Revised: 05/10/2014 Document Reviewed: 01/29/2013 Elsevier Interactive Patient Education 2016 Elsevier Inc.  High-Fiber Diet Fiber, also called dietary fiber, is a type of carbohydrate found in fruits,  vegetables, whole grains, and beans. A high-fiber diet can have many health benefits. Your health care provider may recommend a high-fiber diet to help:  Prevent constipation. Fiber can make your bowel movements more regular.  Lower your cholesterol.  Relieve hemorrhoids, uncomplicated diverticulosis, or irritable bowel syndrome.  Prevent overeating as part of a weight-loss plan.  Prevent heart disease, type 2 diabetes, and certain cancers. WHAT IS MY PLAN? The recommended daily intake of fiber includes:  38 grams for men under age 73.  30 grams for men over age 71.  25 grams for women under age 58.  21 grams for women over age 44. You can get the recommended daily intake of dietary fiber by eating a variety of fruits, vegetables, grains, and beans. Your health care provider may also recommend a fiber supplement if it is not possible to get enough fiber through your diet. WHAT DO I NEED TO KNOW ABOUT A HIGH-FIBER DIET?  Fiber supplements have not been widely studied for their effectiveness, so it is better to get fiber through food sources.  Always check the fiber content on thenutrition facts label of any prepackaged food.  Look for foods that contain at least 5 grams of fiber per serving.  Ask your dietitian if you have questions about specific foods that are related to your condition, especially if those foods are not listed in the following section.  Increase your daily fiber consumption gradually. Increasing your intake of dietary fiber too quickly may cause bloating, cramping, or gas.  Drink plenty of water. Water helps you to digest fiber. WHAT FOODS CAN I EAT? Grains Whole-grain breads. Multigrain cereal. Oats and oatmeal. Brown rice. Barley. Bulgur wheat. Millet. Bran muffins. Popcorn. Rye wafer crackers. Vegetables Sweet potatoes. Spinach. Kale. Artichokes. Cabbage. Broccoli. Green peas. Carrots. Squash. Fruits Berries. Pears. Apples. Oranges. Avocados. Prunes and  raisins. Dried figs. Meats and Other Protein Sources Navy, kidney, pinto, and soy beans. Split peas. Lentils. Nuts and seeds. Dairy Fiber-fortified yogurt. Beverages Fiber-fortified soy milk. Fiber-fortified orange juice. Other Fiber bars. The items listed above may not be a complete list of recommended foods or beverages. Contact your dietitian for more options. WHAT FOODS ARE NOT RECOMMENDED? Grains White bread. Pasta made with refined flour. White rice. Vegetables Fried potatoes. Canned vegetables. Well-cooked vegetables.  Fruits Fruit juice. Cooked, strained fruit. Meats and Other Protein Sources Fatty cuts of meat. Fried Environmental education officer or fried fish. Dairy Milk. Yogurt. Cream cheese. Sour cream. Beverages Soft drinks. Other Cakes and pastries. Butter and oils. The items listed above may not be a complete list of foods and beverages to avoid. Contact your dietitian for more information. WHAT ARE SOME TIPS FOR INCLUDING HIGH-FIBER FOODS IN MY DIET?  Eat a wide variety of high-fiber foods.  Make sure that half of all grains consumed each day are whole grains.  Replace breads and cereals made from refined flour or white flour with whole-grain breads and cereals.  Replace white rice with brown rice, bulgur wheat, or millet.  Start the day with a breakfast that is high in fiber, such as a cereal that contains at least 5 grams of fiber per serving.  Use beans in place of meat in soups, salads, or pasta.  Eat high-fiber snacks, such as berries, raw vegetables, nuts, or popcorn.   This information is not intended to replace advice given to you by your health care provider. Make sure you discuss any questions you have with your health care provider.   Document Released: 04/19/2005 Document Revised: 05/10/2014 Document Reviewed: 10/02/2013 Elsevier Interactive Patient Education [employer]

## 2017-05-19 IMAGING — CR DG ABDOMEN 1V
1 series · 1 of 1 positions shown · non-contrast
Comparison: None.

CLINICAL DATA: Left upper quadrant pain and nausea for 2 days.

EXAM:
ABDOMEN - 1 VIEW

[t abdomen supine]
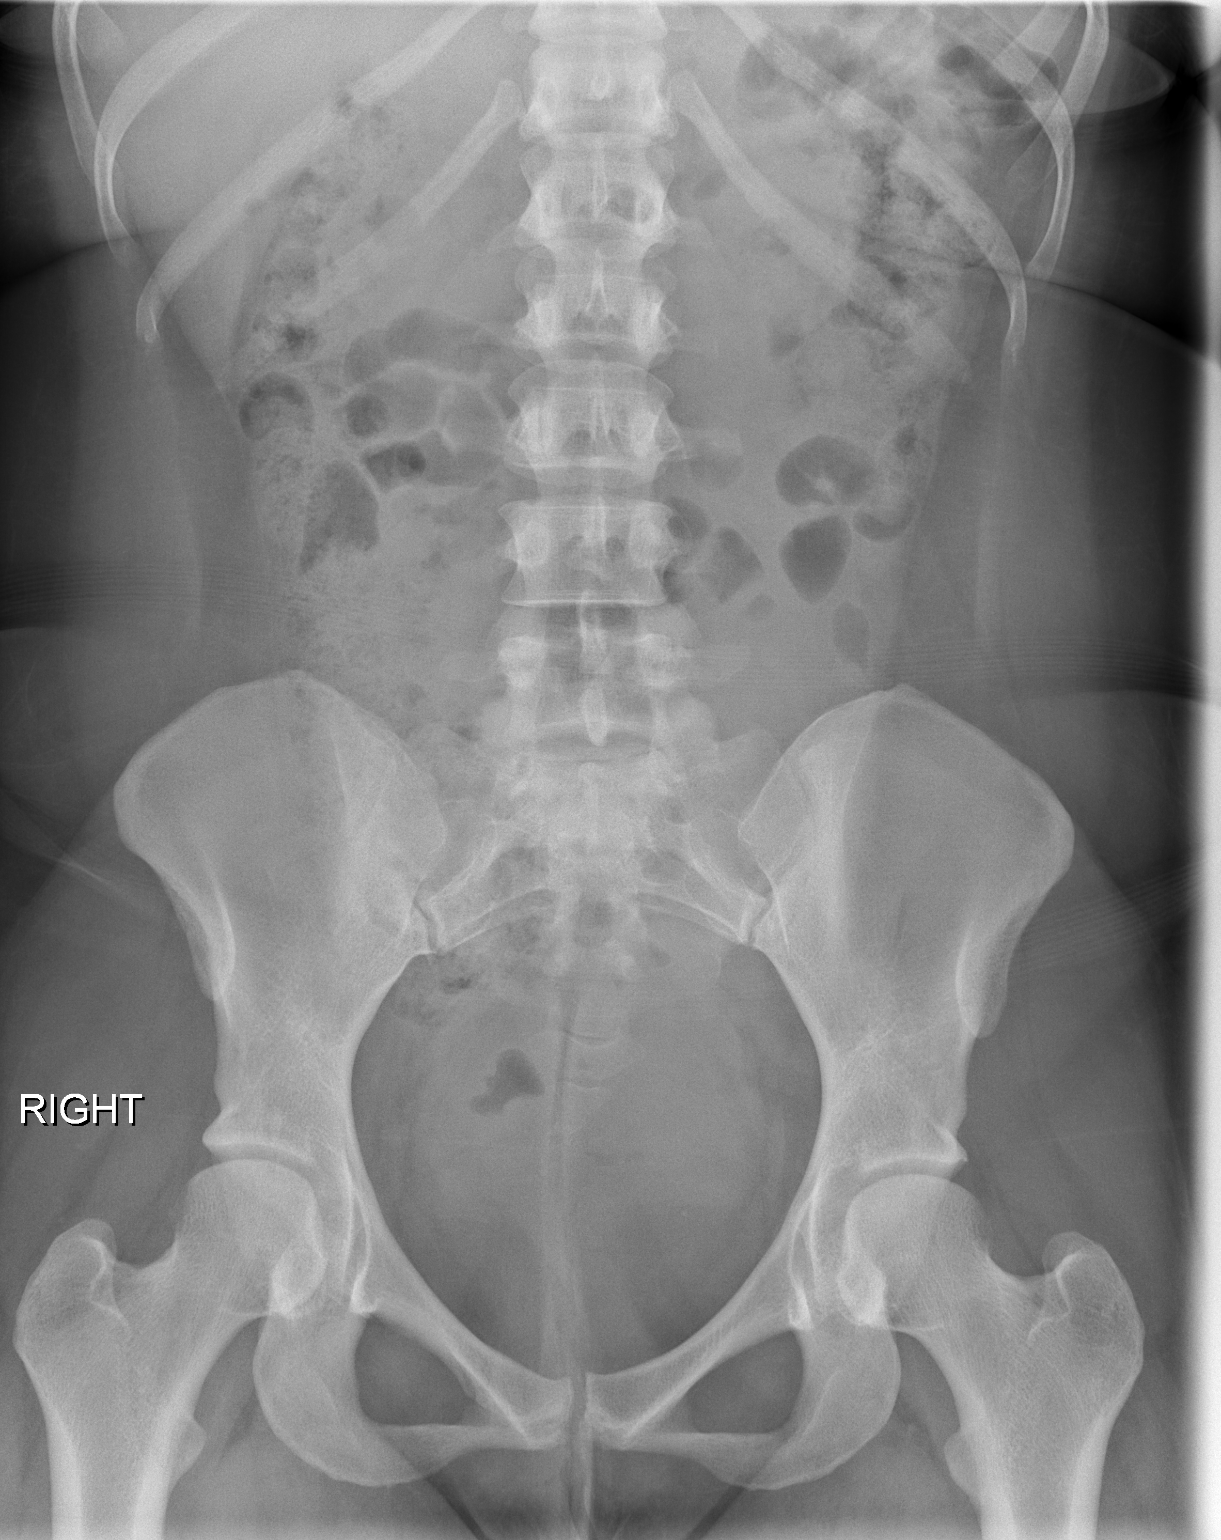

[1 of 1 positions shown; findings below may reference images not displayed]

FINDINGS: The bowel gas pattern is normal. No radio-opaque calculi or other
significant radiographic abnormality are seen.
IMPRESSION: Negative.
